# Patient Record
Sex: Male | Born: 1980 | Race: White | Hispanic: No | Marital: Married | Smoking: Current every day smoker
Health system: Southern US, Community
[De-identification: ages and names within clinical notes are randomized; demographics above are authoritative.]

## PROBLEM LIST (undated history)

## (undated) DIAGNOSIS — F209 Schizophrenia, unspecified: Secondary | ICD-10-CM

## (undated) DIAGNOSIS — Z789 Other specified health status: Secondary | ICD-10-CM

## (undated) HISTORY — PX: FRACTURE SURGERY: SHX138

---

## 2015-05-11 ENCOUNTER — Emergency Department (HOSPITAL_COMMUNITY)
Admission: EM | Admit: 2015-05-11 | Discharge: 2015-05-11 | Disposition: A | Discharge status: Home / Self Care | Payer: Self-pay | Attending: Emergency Medicine | Admitting: Emergency Medicine

## 2015-05-11 ENCOUNTER — Emergency Department (HOSPITAL_COMMUNITY): Payer: Self-pay

## 2015-05-11 ENCOUNTER — Encounter (HOSPITAL_COMMUNITY): Payer: Self-pay | Admitting: Emergency Medicine

## 2015-05-11 DIAGNOSIS — S82892A Other fracture of left lower leg, initial encounter for closed fracture: Secondary | ICD-10-CM

## 2015-05-11 DIAGNOSIS — Z79899 Other long term (current) drug therapy: Secondary | ICD-10-CM | POA: Insufficient documentation

## 2015-05-11 DIAGNOSIS — X58XXXA Exposure to other specified factors, initial encounter: Secondary | ICD-10-CM | POA: Insufficient documentation

## 2015-05-11 DIAGNOSIS — Y9289 Other specified places as the place of occurrence of the external cause: Secondary | ICD-10-CM | POA: Insufficient documentation

## 2015-05-11 DIAGNOSIS — S82899A Other fracture of unspecified lower leg, initial encounter for closed fracture: Secondary | ICD-10-CM | POA: Insufficient documentation

## 2015-05-11 DIAGNOSIS — Z720 Tobacco use: Secondary | ICD-10-CM | POA: Insufficient documentation

## 2015-05-11 DIAGNOSIS — Y9389 Activity, other specified: Secondary | ICD-10-CM | POA: Insufficient documentation

## 2015-05-11 DIAGNOSIS — Y998 Other external cause status: Secondary | ICD-10-CM | POA: Insufficient documentation

## 2015-05-11 MED ORDER — TRAMADOL HCL 50 MG PO TABS
50.0000 mg | ORAL_TABLET | Freq: Once | ORAL | Status: AC
  Administered 2015-05-11: 50 mg via ORAL
  Filled 2015-05-11: qty 1

## 2015-05-11 MED ORDER — TRAMADOL HCL 50 MG PO TABS: 50.0000 mg | ORAL_TABLET | Freq: Four times a day (QID) | ORAL | Status: DC | PRN

## 2015-05-11 NOTE — ED Provider Notes (Signed)
CSN: [id number]     Arrival date & time 05/11/15  2002 History  This chart was scribed for a non-physician practitioner, Earley FavorGail Jen Benedict, NP working with Lorre NickAnthony Allen, MD by Evon Slackerrance Branch, ED Scribe. This patient was seen in room WTR9/WTR9 and the patient's care was started at 9:53 PM.      Chief Complaint  Patient presents with  . Ankle Injury    left   The history is provided by the patient. No language interpreter was used.   HPI Comments: Dale Riley is a 34 y.o. male who presents to the Emergency Department complaining of left ankle injury onset 5 days prior. Pt states he has associated swelling. Pt states that he was stepping off a platform about 32 inches high and rolled his ankle. Pt states that he has tried Advil with no relief. Pt states that the pain is worse when bearing weight or movement. Pt denies any numbness or tingling.    History reviewed. No pertinent past medical history. Past Surgical History  Procedure Laterality Date  . Fracture surgery     History reviewed. No pertinent family history. History  Substance Use Topics  . Smoking status: Current Every Day Smoker -- 2.00 packs/day    Types: Cigarettes  . Smokeless tobacco: Not on file  . Alcohol Use: No    Review of Systems  Musculoskeletal: Positive for joint swelling and arthralgias.  Neurological: Negative for numbness.  All other systems reviewed and are negative.    Allergies  Review of patient's allergies indicates no known allergies.  Home Medications   Prior to Admission medications   Medication Sig Start Date End Date Taking? Authorizing Provider  ibuprofen (ADVIL,MOTRIN) 200 MG tablet Take 800 mg by mouth every 6 (six) hours as needed for moderate pain (pain).   Yes Historical Provider, MD   BP 134/78 mmHg  Pulse 83  Temp(Src) 98.6 F (37 C) (Oral)  Resp 17  SpO2 100%   Physical Exam  Constitutional: He is oriented to person, place, and time. He appears well-developed and  well-nourished. No distress.  HENT:  Head: Normocephalic and atraumatic.  Eyes: Conjunctivae and EOM are normal.  Neck: Neck supple. No tracheal deviation present.  Cardiovascular: Normal rate.   Pulmonary/Chest: Effort normal. No respiratory distress.  Musculoskeletal:       Left ankle: He exhibits decreased range of motion, swelling and ecchymosis. He exhibits no deformity. Tenderness.       Feet:  Neurological: He is alert and oriented to person, place, and time.  Skin: Skin is warm and dry.  Psychiatric: He has a normal mood and affect. His behavior is normal.  Nursing note and vitals reviewed.   ED Course  Procedures (including critical care time) DIAGNOSTIC STUDIES: Oxygen Saturation is 100% on RA, normal by my interpretation.    COORDINATION OF CARE: 10:29 PM-Discussed treatment plan with pt at bedside and pt agreed to plan.    Labs Review Labs Reviewed - No data to display  Imaging Review Dg Ankle Complete Left  05/11/2015   CLINICAL DATA:  Patient stepped off of a platform while at work five days ago causing a twisting injury to the left ankle and Foot. Lateral pain and bruising. Initial encounter.  EXAM: LEFT ANKLE COMPLETE - 3+ VIEW  COMPARISON:  None.  FINDINGS: Tiny avulsion fracture fragment position between the lateral malleolus and the lateral aspect of the talus. No evidence of acute fracture elsewhere. Ankle mortise intact with well preserved joint space. Subchondral lucency  involving the medial aspect of the dome of the talus, surrounded by sclerosis. No other intrinsic osseous abnormalities.  IMPRESSION: 1. Avulsion fracture which likely arose from the lateral aspect of the talus at the insertion of the talofibular ligament. 2. 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   Electronically Signed   By: Hulan Saas M.D.   On: 05/11/2015 21:19   Dg Foot Complete Left  05/11/2015   CLINICAL DATA:  Stepped off platform at work, and rolled left  ankle and foot. Bruising about the foot. Initial encounter.  EXAM: LEFT FOOT - COMPLETE 3+ VIEW  COMPARISON:  None.  FINDINGS: There is no evidence of fracture or dislocation. The joint spaces are preserved. There is no evidence of talar subluxation; the subtalar joint is unremarkable in appearance.  Mild dorsal soft tissue swelling is noted at the forefoot.  IMPRESSION: No evidence of fracture or dislocation.   Electronically Signed   By: Roanna Raider M.D.   On: 05/11/2015 21:26   Will place patient in posterior splint, pain control and follow-up with orthopedic surgery MDM   Final diagnoses:  None      I personally performed the services described in this documentation, which was scribed in my presence. The recorded information has been reviewed and is accurate.    Earley Favor, NP 05/11/15 1610  Lorre Nick, MD 05/11/15 [phone]

## 2015-05-11 NOTE — Discharge Instructions (Signed)
Ankle Fracture  A fracture is a break in a bone. The ankle joint is made up of three bones. These include the lower (distal)sections of your lower leg bones, called the tibia and fibula, along with a bone in your foot, called the talus. Depending on how bad the break is and if more than one ankle joint bone is broken, a cast or splint is used to protect and keep your injured bone from moving while it heals. Sometimes, surgery is required to help the fracture heal properly.   There are two general types of fractures:   Stable fracture. This includes a single fracture line through one bone, with no injury to ankle ligaments. A fracture of the talus that does not have any displacement (movement of the bone on either side of the fracture line) is also stable.   Unstable fracture. This includes more than one fracture line through one or more bones in the ankle joint. It also includes fractures that have displacement of the bone on either side of the fracture line.  CAUSES   A direct blow to the ankle.    Quickly and severely twisting your ankle.   Trauma, such as a car accident or falling from a significant height.  RISK FACTORS  You may be at a higher risk of ankle fracture if:   You have certain medical conditions.   You are involved in high-impact sports.   You are involved in a high-impact car accident.  SIGNS AND SYMPTOMS    Tender and swollen ankle.   Bruising around the injured ankle.   Pain on movement of the ankle.   Difficulty walking or putting weight on the ankle.   A cold foot below the site of the ankle injury. This can occur if the blood vessels passing through your injured ankle were also damaged.   Numbness in the foot below the site of the ankle injury.  DIAGNOSIS   An ankle fracture is usually diagnosed with a physical exam and X-rays. A CT scan may also be required for complex fractures.  TREATMENT   Stable fractures are treated with a cast or splint and using crutches to avoid putting  weight on your injured ankle. This is followed by an ankle strengthening program. Some patients require a special type of cast, depending on other medical problems they may have. Unstable fractures require surgery to ensure the bones heal properly. Your health care provider will tell you what type of fracture you have and the best treatment for your condition.  HOME CARE INSTRUCTIONS    Review correct crutch use with your health care provider and use your crutches as directed. Safe use of crutches is extremely important. Misuse of crutches can cause you to fall or cause injury to nerves in your hands or armpits.   Do not put weight or pressure on the injured ankle until directed by your health care provider.   To lessen the swelling, keep the injured leg elevated while sitting or lying down.   Apply ice to the injured area:   Put ice in a plastic bag.   Place a towel between your cast and the bag.   Leave the ice on for 20 minutes, 2-3 times a day.   If you have a plaster or fiberglass cast:   Do not try to scratch the skin under the cast with any objects. This can increase your risk of skin infection.   Check the skin around the cast every day. You   may put lotion on any red or sore areas.   Keep your cast dry and clean.   If you have a plaster splint:   Wear the splint as directed.   You may loosen the elastic around the splint if your toes become numb, tingle, or turn cold or blue.   Do not put pressure on any part of your cast or splint; it may break. Rest your cast only on a pillow the first 24 hours until it is fully hardened.   Your cast or splint can be protected during bathing with a plastic bag sealed to your skin with medical tape. Do not lower the cast or splint into water.   Take medicines as directed by your health care provider. Only take over-the-counter or prescription medicines for pain, discomfort, or fever as directed by your health care provider.   Do not drive a vehicle until  your health care provider specifically tells you it is safe to do so.   If your health care provider has given you a follow-up appointment, it is very important to keep that appointment. Not keeping the appointment could result in a chronic or permanent injury, pain, and disability. If you have any problem keeping the appointment, call the facility for assistance.  SEEK MEDICAL CARE IF:  You develop increased swelling or discomfort.  SEEK IMMEDIATE MEDICAL CARE IF:    Your cast gets damaged or breaks.   You have continued severe pain.   You develop new pain or swelling after the cast was put on.   Your skin or toenails below the injury turn blue or gray.   Your skin or toenails below the injury feel cold, numb, or have loss of sensitivity to touch.   There is a bad smell or pus draining from under the cast.  MAKE SURE YOU:    Understand these instructions.   Will watch your condition.   Will get help right away if you are not doing well or get worse.  Document Released: 11/24/2000 Document Revised: 12/02/2013 Document Reviewed: 06/26/2013  [employer] Patient Information 2015 [employer]. This information is not intended to replace advice given to you by your health care provider. Make sure you discuss any questions you have with your health care provider.

## 2015-05-11 NOTE — ED Notes (Signed)
Patient reports left ankle injury Thursday. Was stepping off a platform (around 32 inches high) and rolled his ankle over. Says pain has increased but swelling has decreased. No other c/c. Has not had any pain medications today.

## 2015-05-11 NOTE — ED Notes (Signed)
Questions concerns denied r/t dc. Pt ambulatory with crutches. Pt a&ox4

## 2016-06-30 IMAGING — CR DG ANKLE COMPLETE 3+V*L*
3 series · 3 of 3 positions shown · non-contrast
Comparison: None.

CLINICAL DATA: Patient stepped off of a platform while at work five
days ago causing a twisting injury to the left ankle and Foot.
Lateral pain and bruising. Initial encounter.

EXAM:
LEFT ANKLE COMPLETE - 3+ VIEW

[x ankle ap left]
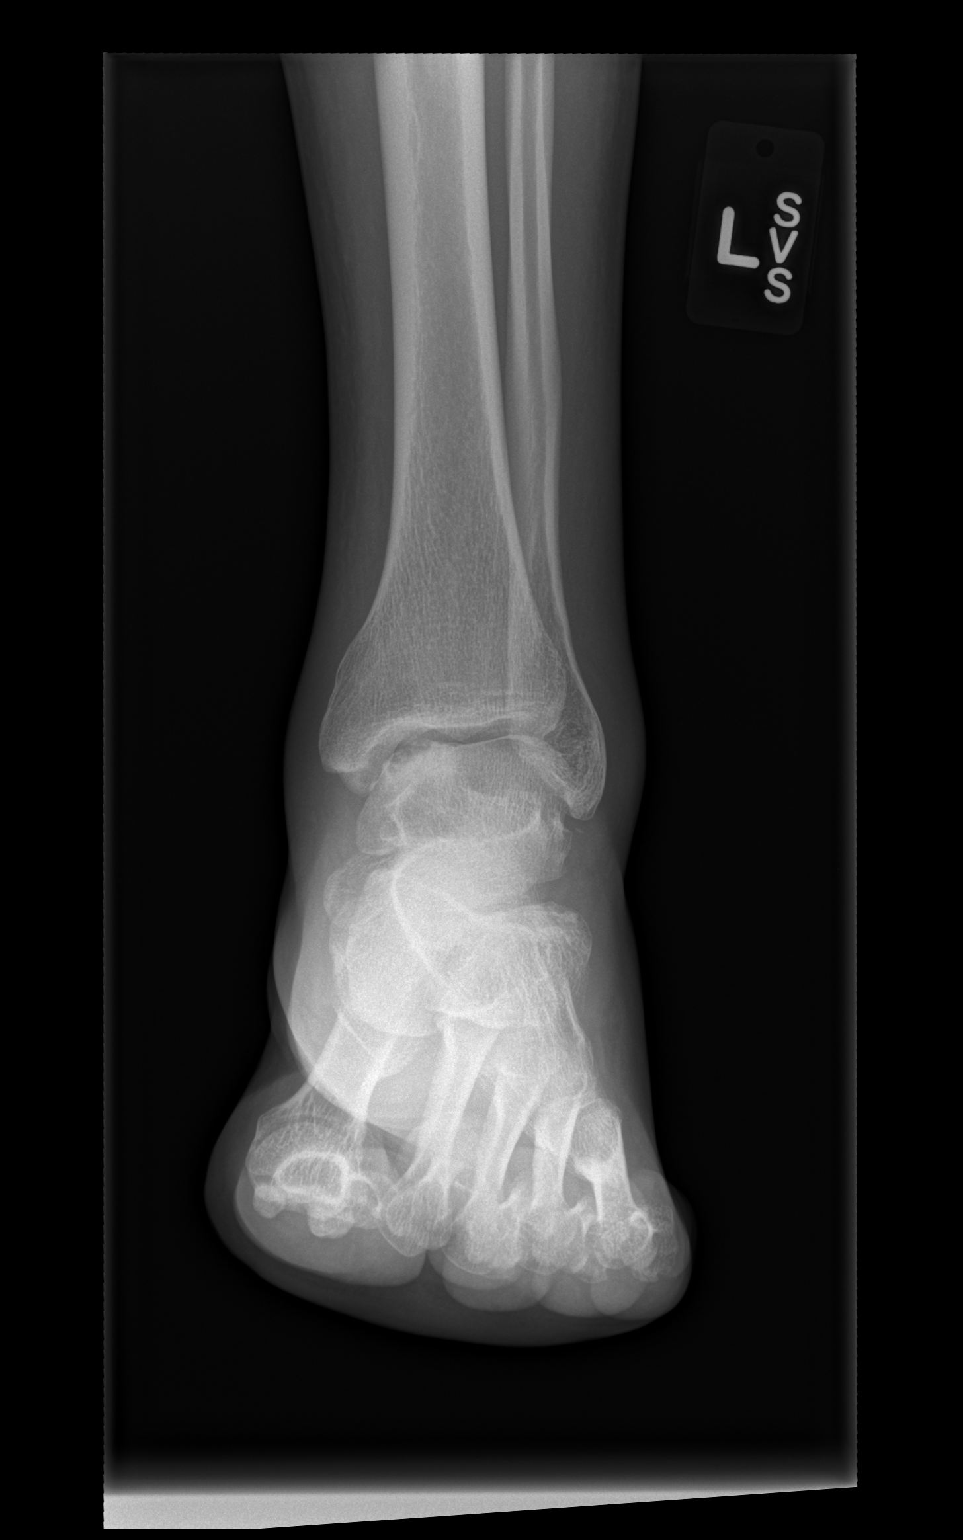

[x ankle obl left]
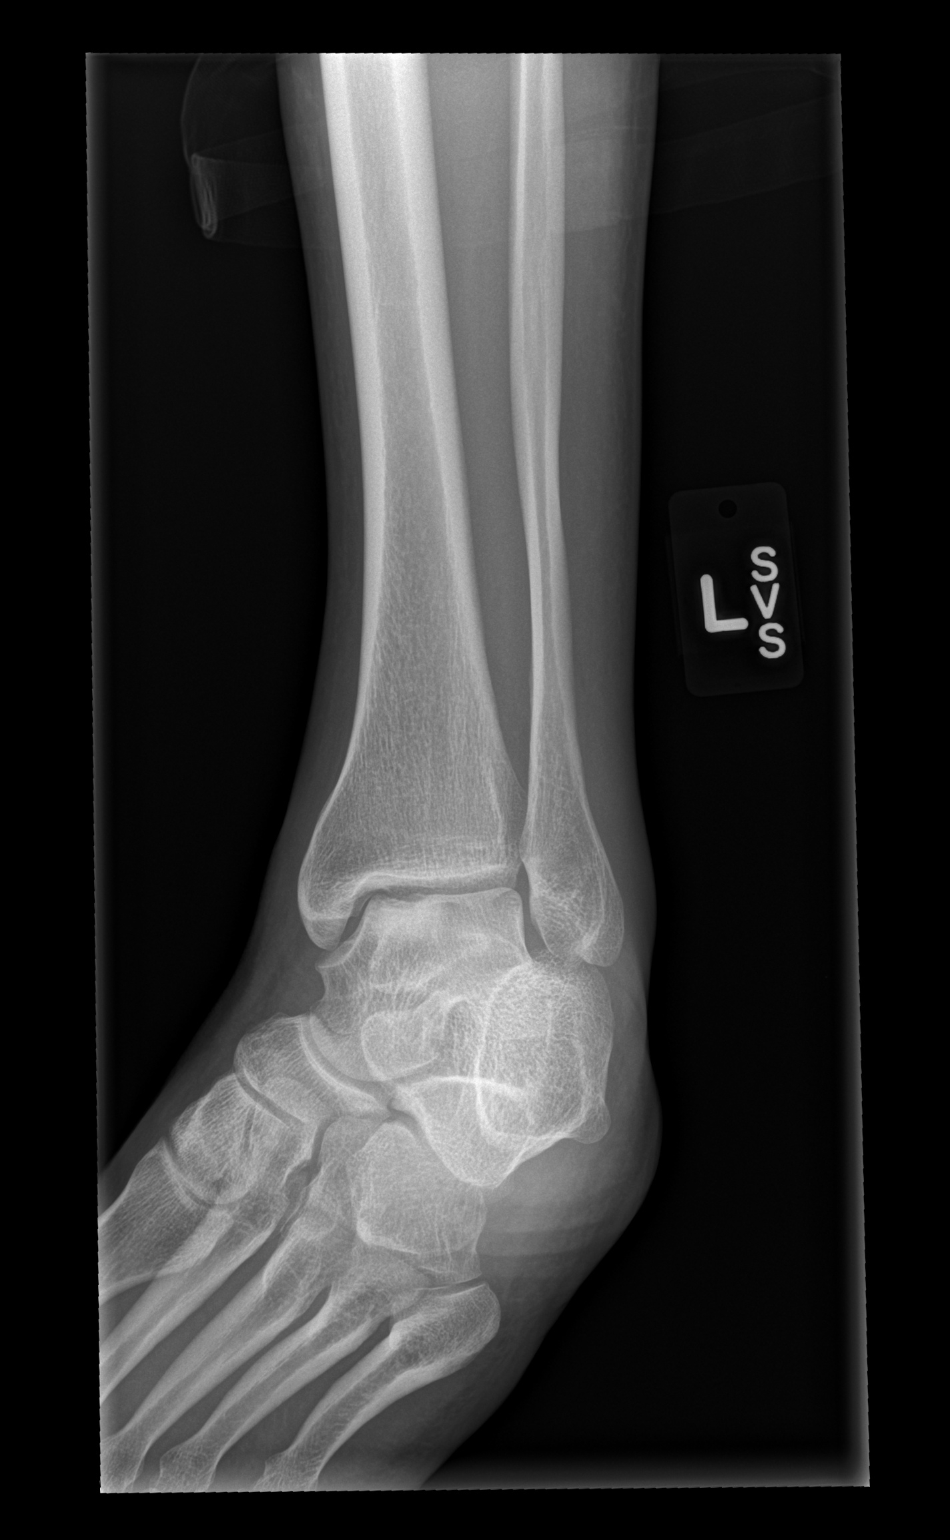

[x ankle lat left]
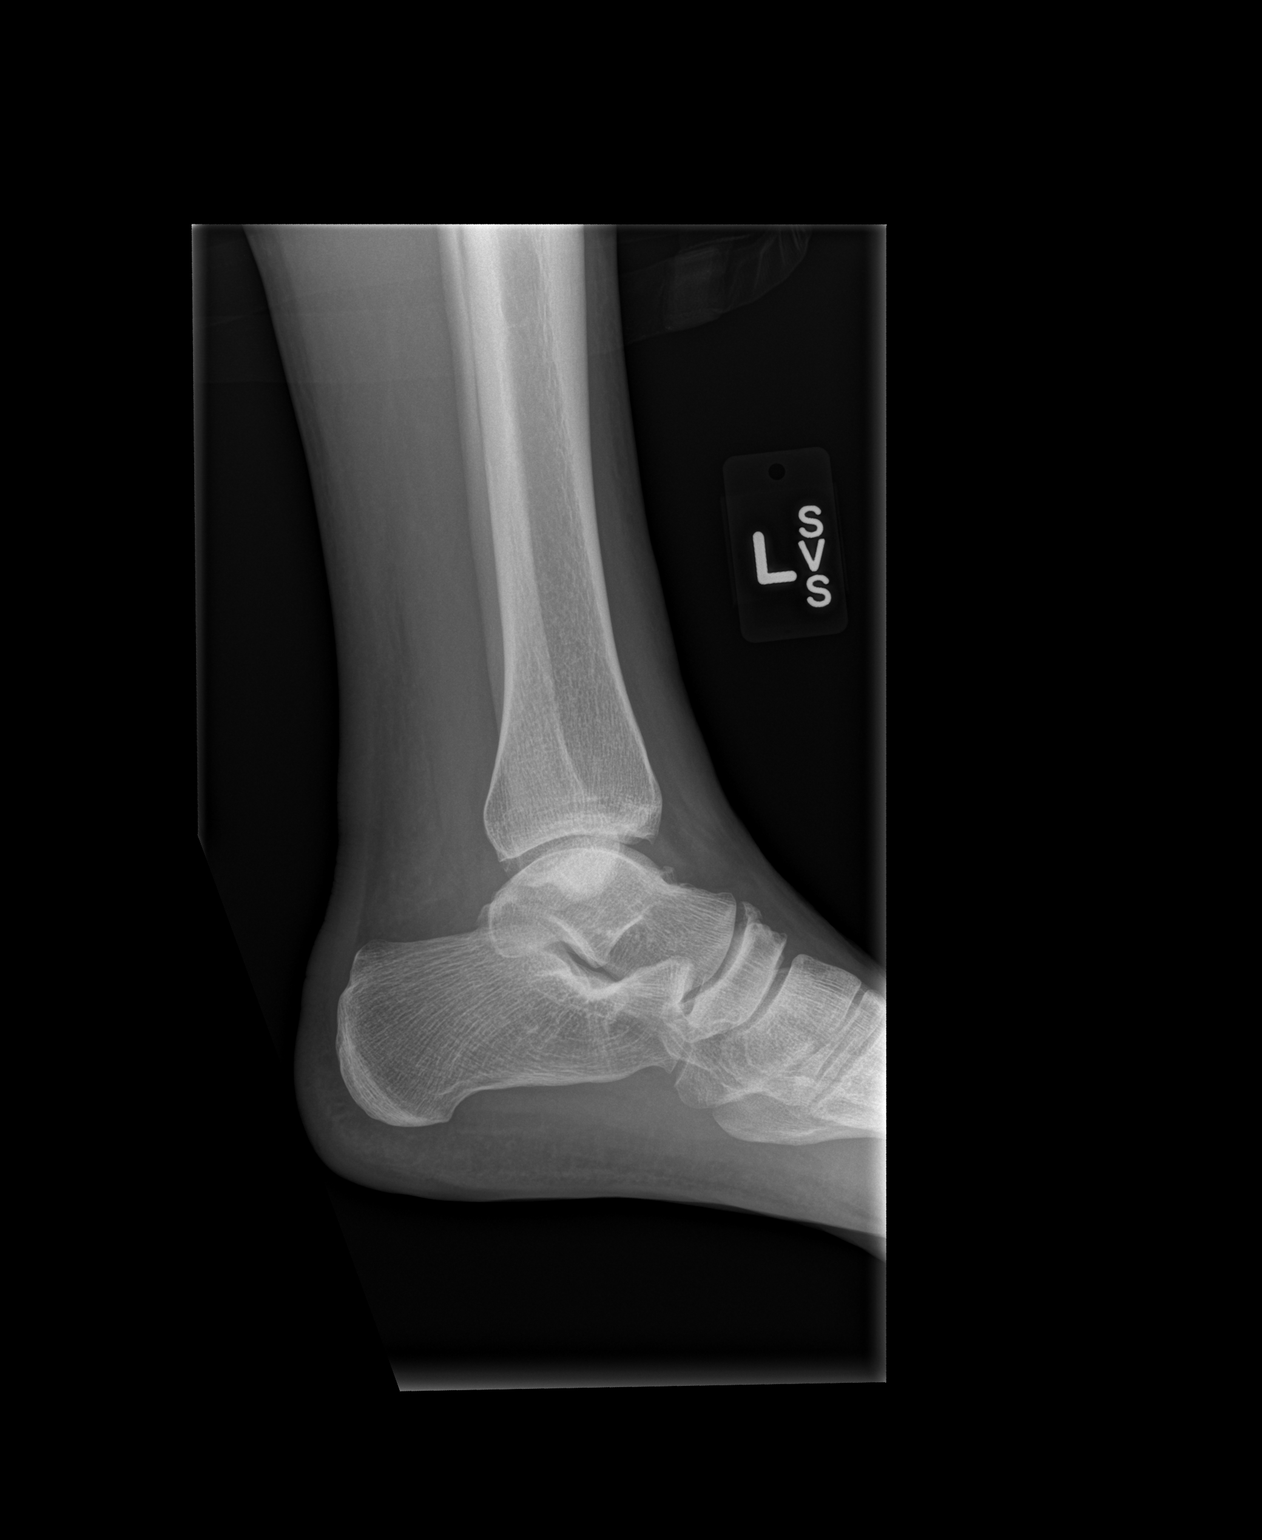

[3 of 3 positions shown; findings below may reference images not displayed]

FINDINGS: Tiny avulsion fracture fragment position between the lateral
malleolus and the lateral aspect of the talus. No evidence of acute
fracture elsewhere. Ankle mortise intact with well preserved joint
space. Subchondral lucency involving the medial aspect of the dome
of the talus, surrounded by sclerosis. No other intrinsic osseous
abnormalities.
IMPRESSION: 1. Avulsion fracture which likely arose from the lateral aspect of
the talus at the insertion of the talofibular ligament.
2. Osteonecrosis involving the medial aspect of the talar dome.

## 2016-07-23 ENCOUNTER — Encounter (HOSPITAL_COMMUNITY): Payer: Self-pay | Admitting: *Deleted

## 2016-07-23 DIAGNOSIS — S80862A Insect bite (nonvenomous), left lower leg, initial encounter: Secondary | ICD-10-CM | POA: Insufficient documentation

## 2016-07-23 DIAGNOSIS — S60562A Insect bite (nonvenomous) of left hand, initial encounter: Secondary | ICD-10-CM | POA: Insufficient documentation

## 2016-07-23 DIAGNOSIS — S80861A Insect bite (nonvenomous), right lower leg, initial encounter: Secondary | ICD-10-CM | POA: Insufficient documentation

## 2016-07-23 DIAGNOSIS — Y929 Unspecified place or not applicable: Secondary | ICD-10-CM | POA: Insufficient documentation

## 2016-07-23 DIAGNOSIS — Y999 Unspecified external cause status: Secondary | ICD-10-CM | POA: Insufficient documentation

## 2016-07-23 DIAGNOSIS — R51 Headache: Secondary | ICD-10-CM | POA: Insufficient documentation

## 2016-07-23 DIAGNOSIS — Y939 Activity, unspecified: Secondary | ICD-10-CM | POA: Insufficient documentation

## 2016-07-23 DIAGNOSIS — S60561A Insect bite (nonvenomous) of right hand, initial encounter: Secondary | ICD-10-CM | POA: Insufficient documentation

## 2016-07-23 DIAGNOSIS — W57XXXA Bitten or stung by nonvenomous insect and other nonvenomous arthropods, initial encounter: Secondary | ICD-10-CM | POA: Insufficient documentation

## 2016-07-23 DIAGNOSIS — F17210 Nicotine dependence, cigarettes, uncomplicated: Secondary | ICD-10-CM | POA: Insufficient documentation

## 2016-07-23 NOTE — ED Triage Notes (Signed)
The pt is c/o a headache all day with n v and body aches.  The pt reports tht his wife pulled over 100 ticks of him yesteerday.  He is here barefoot

## 2016-07-24 ENCOUNTER — Emergency Department (HOSPITAL_COMMUNITY)
Admission: EM | Admit: 2016-07-24 | Discharge: 2016-07-24 | Disposition: A | Discharge status: Home / Self Care | Payer: Self-pay | Attending: Emergency Medicine | Admitting: Emergency Medicine

## 2016-07-24 DIAGNOSIS — R51 Headache: Secondary | ICD-10-CM

## 2016-07-24 DIAGNOSIS — R519 Headache, unspecified: Secondary | ICD-10-CM

## 2016-07-24 DIAGNOSIS — W57XXXA Bitten or stung by nonvenomous insect and other nonvenomous arthropods, initial encounter: Secondary | ICD-10-CM

## 2016-07-24 LAB — BASIC METABOLIC PANEL
Anion gap: 4 — ABNORMAL LOW (ref 5–15)
BUN: 8 mg/dL (ref 6–20)
CALCIUM: 8.9 mg/dL (ref 8.9–10.3)
CO2: 24 mmol/L (ref 22–32)
CREATININE: 0.91 mg/dL (ref 0.61–1.24)
Chloride: 106 mmol/L (ref 101–111)
Glucose, Bld: 132 mg/dL — ABNORMAL HIGH (ref 65–99)
Potassium: 3.5 mmol/L (ref 3.5–5.1)
SODIUM: 134 mmol/L — AB (ref 135–145)

## 2016-07-24 LAB — CBC WITH DIFFERENTIAL/PLATELET
BASOS PCT: 0 %
Basophils Absolute: 0 10*3/uL (ref 0.0–0.1)
EOS ABS: 0.4 10*3/uL (ref 0.0–0.7)
Eosinophils Relative: 4 %
HCT: 40.5 % (ref 39.0–52.0)
HEMOGLOBIN: 14.2 g/dL (ref 13.0–17.0)
Lymphocytes Relative: 23 %
Lymphs Abs: 2.6 10*3/uL (ref 0.7–4.0)
MCH: 30.4 pg (ref 26.0–34.0)
MCHC: 35.1 g/dL (ref 30.0–36.0)
MCV: 86.7 fL (ref 78.0–100.0)
MONOS PCT: 6 %
Monocytes Absolute: 0.6 10*3/uL (ref 0.1–1.0)
NEUTROS PCT: 67 %
Neutro Abs: 7.4 10*3/uL (ref 1.7–7.7)
Platelets: 207 10*3/uL (ref 150–400)
RBC: 4.67 MIL/uL (ref 4.22–5.81)
RDW: 12.9 % (ref 11.5–15.5)
WBC: 11 10*3/uL — AB (ref 4.0–10.5)

## 2016-07-24 MED ORDER — METOCLOPRAMIDE HCL 5 MG/ML IJ SOLN
10.0000 mg | Freq: Once | INTRAMUSCULAR | Status: AC
  Administered 2016-07-24: 10 mg via INTRAVENOUS
  Filled 2016-07-24: qty 2

## 2016-07-24 MED ORDER — DOXYCYCLINE HYCLATE 100 MG PO TABS
100.0000 mg | ORAL_TABLET | Freq: Once | ORAL | Status: AC
  Administered 2016-07-24: 100 mg via ORAL
  Filled 2016-07-24: qty 1

## 2016-07-24 MED ORDER — DIPHENHYDRAMINE HCL 50 MG/ML IJ SOLN
25.0000 mg | Freq: Once | INTRAMUSCULAR | Status: AC
  Administered 2016-07-24: 25 mg via INTRAVENOUS
  Filled 2016-07-24: qty 1

## 2016-07-24 MED ORDER — DOXYCYCLINE HYCLATE 100 MG PO CAPS: 100.0000 mg | ORAL_CAPSULE | Freq: Two times a day (BID) | ORAL | 0 refills | Status: AC

## 2016-07-24 NOTE — ED Provider Notes (Signed)
[facility]-EMERGENCY DEPT Provider Note   CSN: [id number] Arrival date & time: 07/23/16  2300   History   Chief Complaint Chief Complaint  Patient presents with  . Headache    HPI Dale GowdaJoseph Riley is a 35 y.o. male.  Patient with history of migraine headaches presents with complaint of body aches and headache starting today. Patient has not had any associated vision symptoms, URI symptoms, nausea, vomiting, diarrhea. No confusion or altered mental status. No weakness, numbness, tingling in extremities. Normal ambulation. Patient states that he spends a considerable time outside. States that yesterday his friend pulled 100 takes off of him. Patient has one of these in a jar and appears to be a very small insect, smaller than a deer tick. Patient does however report taking ticks off of his body on nearly a daily basis. He has small red bumps where he was bitten, however no significant rashes. No neck pain or stiffness. The onset of this condition was acute. The course is constant. Aggravating factors: light. Alleviating factors: none.        History reviewed. No pertinent past medical history.  There are no active problems to display for this patient.   Past Surgical History:  Procedure Laterality Date  . FRACTURE SURGERY         Home Medications    Prior to Admission medications   Medication Sig Start Date End Date Taking? Authorizing Provider  traMADol (ULTRAM) 50 MG tablet Take 1 tablet (50 mg total) by mouth every 6 (six) hours as needed. Patient not taking: Reported on 07/24/2016 05/11/15   Earley FavorGail Schulz, NP    Family History No family history on file.  Social History Social History  Substance Use Topics  . Smoking status: Current Every Day Smoker    Packs/day: 2.00    Types: Cigarettes  . Smokeless tobacco: Never Used  . Alcohol use No     Allergies   Review of patient's allergies indicates no known allergies.   Review of Systems Review of Systems    Constitutional: Negative for fever.  HENT: Negative for congestion, dental problem, rhinorrhea and sinus pressure.   Eyes: Positive for photophobia. Negative for discharge, redness and visual disturbance.  Respiratory: Negative for shortness of breath.   Cardiovascular: Negative for chest pain.  Gastrointestinal: Negative for nausea and vomiting.  Musculoskeletal: Positive for myalgias. Negative for gait problem, neck pain and neck stiffness.  Skin: Negative for rash.  Neurological: Positive for headaches. Negative for syncope, speech difficulty, weakness, light-headedness and numbness.  Psychiatric/Behavioral: Negative for confusion.     Physical Exam Updated Vital Signs BP 116/84 (BP Location: Right Arm)   Pulse 60   Temp 98.6 F (37 C) (Oral)   Resp 16   Ht 5\' 10"  (1.778 m)   Wt 69.2 kg   SpO2 100%   BMI 21.89 kg/m   Physical Exam  Constitutional: He is oriented to person, place, and time. He appears well-developed and well-nourished.  HENT:  Head: Normocephalic and atraumatic.  Right Ear: Tympanic membrane, external ear and ear canal normal.  Left Ear: Tympanic membrane, external ear and ear canal normal.  Nose: Nose normal.  Mouth/Throat: Uvula is midline, oropharynx is clear and moist and mucous membranes are normal.  Eyes: Conjunctivae, EOM and lids are normal. Pupils are equal, round, and reactive to light.  Neck: Normal range of motion. Neck supple.  No meningismus. Patient with full range of motion in cervical spine without pain.  Cardiovascular: Normal rate and regular rhythm.  Pulmonary/Chest: Effort normal and breath sounds normal.  Abdominal: Soft. There is no tenderness.  Musculoskeletal: Normal range of motion.       Cervical back: He exhibits normal range of motion, no tenderness and no bony tenderness.  Neurological: He is alert and oriented to person, place, and time. He has normal strength and normal reflexes. No cranial nerve deficit or sensory  deficit. He exhibits normal muscle tone. He displays a negative Romberg sign. Coordination and gait normal. GCS eye subscore is 4. GCS verbal subscore is 5. GCS motor subscore is 6.  Skin: Skin is warm and dry.  Patient with small punctate "bite marks" scattered over his torso, upper extremities, lower extremities. No associated cellulitis or rash. No erythema migrans-appearing rashes.  Psychiatric: He has a normal mood and affect.  Nursing note and vitals reviewed.    ED Treatments / Results  Labs (all labs ordered are listed, but only abnormal results are displayed) Labs Reviewed  CBC WITH DIFFERENTIAL/PLATELET  BASIC METABOLIC PANEL    EKG  EKG Interpretation None       Radiology No results found.  Procedures Procedures (including critical care time)  Medications Ordered in ED Medications  metoCLOPramide (REGLAN) injection 10 mg (not administered)  diphenhydrAMINE (BENADRYL) injection 25 mg (not administered)  doxycycline (VIBRA-TABS) tablet 100 mg (not administered)     Initial Impression / Assessment and Plan / ED Course  I have reviewed the triage vital signs and the nursing notes.  Pertinent labs & imaging results that were available during my care of the patient were reviewed by me and considered in my medical decision making (see chart for details).  Clinical Course  Comment By Time  Patient seen and examined. Will treat headache, check basic labs. Patient has no signs concerning for meningitis. Appears well, non-toxic. Laughing in room.  Renne CriglerJoshua Leslyn Monda, PA-C 08/14 [phone]  Patient feels better. Agreeable to discharge to home. Exam stable. No signs of meningismus. No fever.  Discussed return to the emergency department with worsening severe headache, vomiting, confusion, fever, more prominent rash. Patient and wife verbalized understanding and agrees with plan.  Discussed UV exposure precautions when on doxycycline. Patient is a Psychologist, occupationalwelder. He understands that he  needs to protect exposed areas of skin in sunlight and while working. Renne CriglerJoshua Aribelle Mccosh, PA-C 08/14 0220      Final Clinical Impressions(s) / ED Diagnoses   Final diagnoses:  Acute nonintractable headache, unspecified headache type  Tick bite   Patient with HA, possible recent tick-bites.H/o migraine HA.  Patient without high-risk features of headache including: sudden onset/thunderclap HA, altered mental status, accompanying seizure, headache with exertion, age > 9150, history of immunocompromise, neck or shoulder pain, fever, use of anticoagulation, family history of spontaneous SAH, concomitant drug use, toxic exposure.   Patient has a normal complete neurological exam, normal vital signs, normal level of consciousness, no signs of meningismus, is well-appearing/non-toxic appearing, no signs of trauma.   Will treat with doxycycline out of abundance of caution due to possible exposure with atypical HA.   Imaging with CT/MRI not indicated given history and physical exam findings.   No dangerous or life-threatening conditions suspected or identified by history, physical exam, and by work-up. No indications for hospitalization identified.    New Prescriptions New Prescriptions   DOXYCYCLINE (VIBRAMYCIN) 100 MG CAPSULE    Take 1 capsule (100 mg total) by mouth 2 (two) times daily.     Renne CriglerJoshua Darthy Manganelli, PA-C 07/24/16 0222    Charlynne Panderavid Hsienta Yao, MD  07/28/16 1540  

## 2016-07-24 NOTE — Discharge Instructions (Signed)
Please read and follow all provided instructions.  Your diagnoses today include:  1. Acute nonintractable headache, unspecified headache type   2. Tick bite     Tests performed today include:  Vital signs. See below for your results today.   Medications:  In the Emergency Department you received:  Reglan - antinausea/headache medication  Benadryl - antihistamine to counteract potential side effects of reglan   Doxycycline - antibiotic  You have been prescribed an antibiotic medicine: take the entire course of medicine even if you are feeling better. Stopping early can cause the antibiotic not to work.  Take any prescribed medications only as directed.  Additional information:  Follow any educational materials contained in this packet.  You are having a headache. No specific cause was found today for your headache. It may have been a migraine or other cause of headache. Stress, anxiety, fatigue, and depression are common triggers for headaches.   Your headache today does not appear to be life-threatening or require hospitalization, but often the exact cause of headaches is not determined in the emergency department. Therefore, follow-up with your doctor is very important to find out what may have caused your headache and whether or not you need any further diagnostic testing or treatment.   Sometimes headaches can appear benign (not harmful), but then more serious symptoms can develop which should prompt an immediate re-evaluation by your doctor or the emergency department.  BE VERY CAREFUL not to take multiple medicines containing Tylenol (also called acetaminophen). Doing so can lead to an overdose which can damage your liver and cause liver failure and possibly death.   Follow-up instructions: Please follow-up with your primary care provider in the next 3 days for further evaluation of your symptoms.   Return instructions:   Please return to the Emergency Department if you  experience worsening symptoms.  Return if the medications do not resolve your headache, if it recurs, or if you have multiple episodes of vomiting or cannot keep down fluids.  Return if you have a change from the usual headache.  RETURN IMMEDIATELY IF you:  Develop a sudden, severe headache  Develop confusion or become poorly responsive or faint  Develop a fever above 100.87F or problem breathing  Have a change in speech, vision, swallowing, or understanding  Develop new weakness, numbness, tingling, incoordination in your arms or legs  Have a seizure  Please return if you have any other emergent concerns.  Additional Information:  Your vital signs today were: BP 112/74    Pulse (!) 55    Temp 98.6 F (37 C) (Oral)    Resp 16    Ht 5\' 10"  (1.778 m)    Wt 69.2 kg    SpO2 100%    BMI 21.89 kg/m  If your blood pressure (BP) was elevated above 135/85 this visit, please have this repeated by your doctor within one month. --------------

## 2017-12-21 ENCOUNTER — Emergency Department (HOSPITAL_COMMUNITY)
Admission: EM | Admit: 2017-12-21 | Discharge: 2017-12-21 | Disposition: A | Discharge status: Home / Self Care | Payer: Self-pay | Attending: Emergency Medicine | Admitting: Emergency Medicine

## 2017-12-21 ENCOUNTER — Emergency Department (HOSPITAL_COMMUNITY): Payer: Self-pay

## 2017-12-21 ENCOUNTER — Encounter (HOSPITAL_COMMUNITY): Payer: Self-pay | Admitting: Emergency Medicine

## 2017-12-21 DIAGNOSIS — S233XXA Sprain of ligaments of thoracic spine, initial encounter: Secondary | ICD-10-CM | POA: Insufficient documentation

## 2017-12-21 DIAGNOSIS — M549 Dorsalgia, unspecified: Secondary | ICD-10-CM

## 2017-12-21 DIAGNOSIS — Y9259 Other trade areas as the place of occurrence of the external cause: Secondary | ICD-10-CM | POA: Insufficient documentation

## 2017-12-21 DIAGNOSIS — S239XXA Sprain of unspecified parts of thorax, initial encounter: Secondary | ICD-10-CM

## 2017-12-21 DIAGNOSIS — Y9389 Activity, other specified: Secondary | ICD-10-CM | POA: Insufficient documentation

## 2017-12-21 DIAGNOSIS — Y990 Civilian activity done for income or pay: Secondary | ICD-10-CM | POA: Insufficient documentation

## 2017-12-21 DIAGNOSIS — X500XXA Overexertion from strenuous movement or load, initial encounter: Secondary | ICD-10-CM | POA: Insufficient documentation

## 2017-12-21 DIAGNOSIS — F17210 Nicotine dependence, cigarettes, uncomplicated: Secondary | ICD-10-CM | POA: Insufficient documentation

## 2017-12-21 MED ORDER — CYCLOBENZAPRINE HCL 10 MG PO TABS: 10.0000 mg | ORAL_TABLET | Freq: Two times a day (BID) | ORAL | 0 refills | Status: AC | PRN

## 2017-12-21 MED ORDER — CYCLOBENZAPRINE HCL 10 MG PO TABS
10.0000 mg | ORAL_TABLET | Freq: Once | ORAL | Status: AC
  Administered 2017-12-21: 10 mg via ORAL
  Filled 2017-12-21: qty 1

## 2017-12-21 MED ORDER — KETOROLAC TROMETHAMINE 30 MG/ML IJ SOLN
30.0000 mg | Freq: Once | INTRAMUSCULAR | Status: AC
  Administered 2017-12-21: 30 mg via INTRAMUSCULAR
  Filled 2017-12-21: qty 1

## 2017-12-21 NOTE — ED Provider Notes (Signed)
[facility]-EMERGENCY DEPT Provider Note   CSN: [id number] Arrival date & time: 12/21/17  [id number]     History   Chief Complaint Chief Complaint  Patient presents with  . Back Pain    HPI Dale GowdaJoseph Riley is a 37 y.o. male.  The history is provided by the patient.  Back Pain   This is a new problem. Episode onset: 3 days ago. The problem occurs constantly. 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 The pain is present in the thoracic spine. The quality of the pain is described as shooting and stabbing. The pain does not radiate. The pain is at a severity of 9/10. The pain is severe. The symptoms are aggravated by bending, twisting and certain positions. The pain is worse during the night. Stiffness is present in the morning. Pertinent negatives include no chest pain, no fever, no numbness, no bowel incontinence, no bladder incontinence, no dysuria, no leg pain, no paresthesias, no paresis, no tingling and no weakness. Associated symptoms comments: No SOB or cough. He has tried NSAIDs for the symptoms. The treatment provided no relief.    History reviewed. No pertinent past medical history.  There are no active problems to display for this patient.   Past Surgical History:  Procedure Laterality Date  . FRACTURE SURGERY         Home Medications    Prior to Admission medications   Medication Sig Start Date End Date Taking? Authorizing Provider  doxycycline (VIBRAMYCIN) 100 MG capsule Take 1 capsule (100 mg total) by mouth 2 (two) times daily. 07/24/16   Renne CriglerGeiple, Joshua, PA-C  traMADol (ULTRAM) 50 MG tablet Take 1 tablet (50 mg total) by mouth every 6 (six) hours as needed. Patient not taking: Reported on 07/24/2016 05/11/15   Earley FavorSchulz, Gail, NP    Family History No family history on file.  Social History Social History   Tobacco Use    . Smoking status: Current Every Day Smoker    Packs/day: 2.00    Types: Cigarettes  . Smokeless tobacco: Never Used  Substance Use Topics  . Alcohol use: No  . Drug use: Not on file     Allergies   Patient has no known allergies.   Review of Systems Review of Systems  Constitutional: Negative for fever.  Cardiovascular: Negative for chest pain.  Gastrointestinal: Negative for bowel incontinence.  Genitourinary: Negative for bladder incontinence and dysuria.  Musculoskeletal: Positive for back pain.  Neurological: Negative for tingling, weakness, numbness and paresthesias.  All other systems reviewed and are negative.    Physical Exam Updated Vital Signs BP (!) 151/97 (BP Location: Right Arm)   Pulse 98   Temp (!) 97.4 F (36.3 C) (Oral)   Resp 17   SpO2 100%   Physical Exam  Constitutional: He is oriented to person, place, and time. He appears well-developed and well-nourished. No distress.  HENT:  Head: Normocephalic and atraumatic.  Mouth/Throat: Oropharynx is clear and moist.  Eyes: Conjunctivae and EOM are normal. Pupils are equal, round, and reactive to light.  Neck: Normal range of motion. Neck supple.  Cardiovascular: Normal rate, regular rhythm and intact distal pulses.  No murmur heard. Pulmonary/Chest: Effort normal and breath sounds normal. No respiratory distress. He has no wheezes. He has no rales.  Abdominal: Soft. He exhibits no distension. There is no tenderness. There is no rebound  and no guarding.  Musculoskeletal: Normal range of motion. He exhibits no edema.       Thoracic back: He exhibits tenderness and bony tenderness. He exhibits normal range of motion and normal pulse.       Back:  Neurological: He is alert and oriented to person, place, and time.  5/5 strength in the bilateral upper and lower ext.  Gait wnl.  Skin: Skin is warm and dry. No rash noted. No erythema.  Psychiatric: He has a normal mood and affect. His behavior is normal.   Nursing note and vitals reviewed.    ED Treatments / Results  Labs (all labs ordered are listed, but only abnormal results are displayed) Labs Reviewed - No data to display  EKG  EKG Interpretation None       Radiology Dg Thoracic Spine W/swimmers  Result Date: 12/21/2017 CLINICAL DATA:  Back pain EXAM: THORACIC SPINE - 3 VIEWS COMPARISON:  None. FINDINGS: Spurring in the lower thoracic spine. Normal alignment. No fracture. Visualized lungs clear. Heart is normal size. No effusions. IMPRESSION: No acute bony abnormality. Electronically Signed   By: Charlett Nose M.D.   On: 12/21/2017 11:32    Procedures Procedures (including critical care time)  Medications Ordered in ED Medications  ketorolac (TORADOL) 30 MG/ML injection 30 mg (not administered)  cyclobenzaprine (FLEXERIL) tablet 10 mg (not administered)     Initial Impression / Assessment and Plan / ED Course  I have reviewed the triage vital signs and the nursing notes.  Pertinent labs & imaging results that were available during my care of the patient were reviewed by me and considered in my medical decision making (see chart for details).     Pt with gradual onset of back pain suggestive of muscular vs DDD.  No neurovascular compromise and no incontinence.  Pt has no infectious sx, hx of CA  or other red flags concerning for pathologic back pain. Pt does not use IVD. Pt is able to ambulate but is painful.  Normal strength and sensation on exam.  Denies trauma but does lift heavy objects routinely. Will give pt pain control and get thoracic imaging. to return for developement of above sx.  12:10 PM Imaging wnl.   Final Clinical Impressions(s) / ED Diagnoses   Final diagnoses:  Back pain    ED Discharge Orders        Ordered    cyclobenzaprine (FLEXERIL) 10 MG tablet  2 times daily PRN     12/21/17 1208       Gwyneth Sprout, MD 12/21/17 1211

## 2017-12-21 NOTE — Discharge Instructions (Signed)
Try lidocaine patches to help with pain and hot showers.  Limit lifting.  Only Ibuprofen 600-800mg  every 6 hours

## 2017-12-21 NOTE — ED Notes (Signed)
Bed: WA31 Expected date:  Expected time:  Means of arrival:  Comments: 

## 2017-12-21 NOTE — ED Notes (Signed)
Patient transported to X-ray 

## 2017-12-21 NOTE — ED Triage Notes (Signed)
Patient c/o mid back pain for several days that is constant. Denies any falls or injuries.

## 2017-12-21 NOTE — ED Notes (Signed)
ED Provider at bedside. PLUNKETT AT BEDSIDE

## 2019-04-02 ENCOUNTER — Emergency Department (HOSPITAL_COMMUNITY)
Admission: EM | Admit: 2019-04-02 | Discharge: 2019-04-02 | Disposition: A | Discharge status: Home / Self Care | Payer: Medicaid Other | Attending: Emergency Medicine | Admitting: Emergency Medicine

## 2019-04-02 ENCOUNTER — Other Ambulatory Visit: Payer: Self-pay

## 2019-04-02 ENCOUNTER — Encounter (HOSPITAL_COMMUNITY): Payer: Self-pay

## 2019-04-02 DIAGNOSIS — X58XXXA Exposure to other specified factors, initial encounter: Secondary | ICD-10-CM | POA: Diagnosis not present

## 2019-04-02 DIAGNOSIS — Y9389 Activity, other specified: Secondary | ICD-10-CM | POA: Diagnosis not present

## 2019-04-02 DIAGNOSIS — Y929 Unspecified place or not applicable: Secondary | ICD-10-CM | POA: Insufficient documentation

## 2019-04-02 DIAGNOSIS — Y999 Unspecified external cause status: Secondary | ICD-10-CM | POA: Insufficient documentation

## 2019-04-02 DIAGNOSIS — S61317A Laceration without foreign body of left little finger with damage to nail, initial encounter: Secondary | ICD-10-CM | POA: Diagnosis not present

## 2019-04-02 DIAGNOSIS — S6992XA Unspecified injury of left wrist, hand and finger(s), initial encounter: Secondary | ICD-10-CM | POA: Diagnosis present

## 2019-04-02 DIAGNOSIS — F17210 Nicotine dependence, cigarettes, uncomplicated: Secondary | ICD-10-CM | POA: Diagnosis not present

## 2019-04-02 DIAGNOSIS — Z79899 Other long term (current) drug therapy: Secondary | ICD-10-CM | POA: Diagnosis not present

## 2019-04-02 HISTORY — DX: Other specified health status: Z78.9

## 2019-04-02 HISTORY — DX: Schizophrenia, unspecified: F20.9

## 2019-04-02 MED ORDER — LIDOCAINE HCL 2 % IJ SOLN
20.0000 mL | Freq: Once | INTRAMUSCULAR | Status: AC
  Administered 2019-04-02: 400 mg
  Filled 2019-04-02: qty 20

## 2019-04-02 NOTE — ED Provider Notes (Addendum)
[name] EMERGENCY DEPARTMENT Provider Note   CSN: [id number] Arrival date & time: 04/02/19  1348    History   Chief Complaint Chief Complaint  Patient presents with  . Finger Injury    HPI Dale Riley is a 38 y.o. male.     HPI   38 year old male presents today with a laceration to his left finger.  Patient notes he was working on a car when he caused a laceration to the ulnar aspect of the pinky.  He denies any loss of sensation strength or range of motion of the finger.  He notes his last tetanus shot was in 2017.  Past Medical History:  Diagnosis Date  . Medical history non-contributory   . Schizophrenic disorder (HCC)     There are no active problems to display for this patient.   Past Surgical History:  Procedure Laterality Date  . FRACTURE SURGERY          Home Medications    Prior to Admission medications   Medication Sig Start Date End Date Taking? Authorizing Provider  cyclobenzaprine (FLEXERIL) 10 MG tablet Take 1 tablet (10 mg total) by mouth 2 (two) times daily as needed for muscle spasms. 12/21/17   Gwyneth Sprout, MD  doxycycline (VIBRAMYCIN) 100 MG capsule Take 1 capsule (100 mg total) by mouth 2 (two) times daily. 07/24/16   Renne Crigler, PA-C  traMADol (ULTRAM) 50 MG tablet Take 1 tablet (50 mg total) by mouth every 6 (six) hours as needed. Patient not taking: Reported on 07/24/2016 05/11/15   Earley Favor, NP    Family History History reviewed. No pertinent family history.  Social History Social History   Tobacco Use  . Smoking status: Current Every Day Smoker    Packs/day: 2.00    Types: Cigarettes  . Smokeless tobacco: Never Used  Substance Use Topics  . Alcohol use: No  . Drug use: Never     Allergies   Patient has no known allergies.   Review of Systems Review of Systems  All other systems reviewed and are negative.    Physical Exam Updated Vital Signs BP (!) 126/96 (BP Location: Right Arm)    Pulse 78   Temp 98.6 F (37 C) (Oral)   Resp 18   Ht 5\' 10"  (1.778 m)   Wt 69.9 kg   SpO2 100%   BMI 22.10 kg/m   Physical Exam Vitals signs and nursing note reviewed.  Constitutional:      Appearance: He is well-developed.  HENT:     Head: Normocephalic and atraumatic.  Eyes:     General: No scleral icterus.       Right eye: No discharge.        Left eye: No discharge.     Conjunctiva/sclera: Conjunctivae normal.     Pupils: Pupils are equal, round, and reactive to light.  Neck:     Musculoskeletal: Normal range of motion.     Vascular: No JVD.     Trachea: No tracheal deviation.  Pulmonary:     Effort: Pulmonary effort is normal.     Breath sounds: No stridor.  Musculoskeletal:     Comments: 2.5 cm laceration noted to the left ulnar pinky, no deep space involvement, full active range of motion and's resistance and sensation  Neurological:     Mental Status: He is alert and oriented to person, place, and time.     Coordination: Coordination normal.  Psychiatric:  Behavior: Behavior normal.        Thought Content: Thought content normal.        Judgment: Judgment normal.      ED Treatments / Results  Labs (all labs ordered are listed, but only abnormal results are displayed) Labs Reviewed - No data to display  EKG None  Radiology No results found.  Procedures .Marland Kitchen.Laceration Repair Date/Time: 04/02/2019 3:05 PM Performed by: Eyvonne MechanicHedges, Akim Watkinson, PA-C Authorized by: Eyvonne MechanicHedges, Jamieson Lisa, PA-C   Consent:    Consent obtained:  Verbal   Consent given by:  Patient   Risks discussed:  Infection, need for additional repair, poor cosmetic result and pain   Alternatives discussed:  No treatment and delayed treatment Anesthesia (see MAR for exact dosages):    Anesthesia method:  Local infiltration   Local anesthetic:  Lidocaine 2% w/o epi Laceration details:    Location: Left pinky.   Length (cm):  2.5 Repair type:    Repair type:  Simple Pre-procedure  details:    Preparation:  Patient was prepped and draped in usual sterile fashion Exploration:    Hemostasis achieved with:  Direct pressure   Wound exploration: wound explored through full range of motion and entire depth of wound probed and visualized     Wound extent: no fascia violation noted, no foreign bodies/material noted, no muscle damage noted, no nerve damage noted, no tendon damage noted, no underlying fracture noted and no vascular damage noted     Contaminated: yes   Treatment:    Area cleansed with:  Betadine   Amount of cleaning:  Extensive   Irrigation solution:  Tap water   Irrigation method:  Tap   Visualized foreign bodies/material removed: no   Skin repair:    Repair method:  Sutures   Suture size:  4-0   Suture material:  Fast-absorbing gut   Suture technique:  Simple interrupted   Number of sutures:  7 Post-procedure details:    Dressing:  Non-adherent dressing   Patient tolerance of procedure:  Tolerated well, no immediate complications   (including critical care time)  Medications Ordered in ED Medications  lidocaine (XYLOCAINE) 2 % (with pres) injection 400 mg (has no administration in time range)     Initial Impression / Assessment and Plan / ED Course  I have reviewed the triage vital signs and the nursing notes.  Pertinent labs & imaging results that were available during my care of the patient were reviewed by me and considered in my medical decision making (see chart for details).        Labs:   Imaging:  Consults:  Therapeutics:  Discharge Meds:   Assessment/Plan: 38 year old male presents today with laceration.  No signs of deep space involvement.  Discharged with symptomatic care and strict return precautions.     Final Clinical Impressions(s) / ED Diagnoses   Final diagnoses:  Laceration of left little finger without foreign body with damage to nail, initial encounter    ED Discharge Orders    None       Rosalio LoudHedges,  Tadarrius Burch, PA-C 04/02/19 1507    Khianna Blazina, Tinnie GensJeffrey, PA-C 04/02/19 1509    Lockie Molauratolo, Adam, DO 04/02/19 1547

## 2019-04-02 NOTE — ED Notes (Signed)
During pts period of not speaking or responding to staff, phone rang and pt was handed phone. Pt answered phone and began to speak to person on other end. Pt GCS 15, A&Ox4, NARD.

## 2019-04-02 NOTE — ED Notes (Signed)
Pt arrives back from CT and transporter asked this RN to step in room. Pt laying on stretcher w/ eyes open, respirations are even and unlabored and pt in no acute distress. Attempted to speak to pt, pt would not respond. Attempted sternal rub w/ no change in status. Attempted visual threat w/ no response. Pt is maintaining own airway, satting 97%, NARD/NAD. PA Hedges made aware of pt status, to bedside to assess. No new orders at this time.

## 2019-04-02 NOTE — Discharge Instructions (Addendum)
Please read attached information. If you experience any new or worsening signs or symptoms please return to the emergency room for evaluation. Please follow-up with your primary care provider or specialist as discussed.  °

## 2019-04-02 NOTE — ED Triage Notes (Signed)
Pt arrives POV for eval of L pinkie finger lac sustained while changing brakes in his car. Reports lac is to the bone, hemostatic on arrival w/ dsg in place. States last tetanus 2017.

## 2021-04-19 ENCOUNTER — Encounter (HOSPITAL_COMMUNITY): Payer: Self-pay | Admitting: Emergency Medicine

## 2021-04-19 ENCOUNTER — Other Ambulatory Visit: Payer: Self-pay

## 2021-04-19 ENCOUNTER — Emergency Department (HOSPITAL_COMMUNITY)
Admission: EM | Admit: 2021-04-19 | Discharge: 2021-04-19 | Disposition: A | Discharge status: Home / Self Care | Payer: Medicaid Other | Attending: Emergency Medicine | Admitting: Emergency Medicine

## 2021-04-19 ENCOUNTER — Emergency Department (HOSPITAL_COMMUNITY): Payer: Medicaid Other

## 2021-04-19 DIAGNOSIS — S61210A Laceration without foreign body of right index finger without damage to nail, initial encounter: Secondary | ICD-10-CM | POA: Insufficient documentation

## 2021-04-19 DIAGNOSIS — W293XXA Contact with powered garden and outdoor hand tools and machinery, initial encounter: Secondary | ICD-10-CM | POA: Insufficient documentation

## 2021-04-19 DIAGNOSIS — F17210 Nicotine dependence, cigarettes, uncomplicated: Secondary | ICD-10-CM | POA: Diagnosis not present

## 2021-04-19 DIAGNOSIS — S6991XA Unspecified injury of right wrist, hand and finger(s), initial encounter: Secondary | ICD-10-CM | POA: Diagnosis present

## 2021-04-19 MED ORDER — TRAMADOL HCL 50 MG PO TABS: 50.0000 mg | ORAL_TABLET | Freq: Four times a day (QID) | ORAL | 0 refills | Status: AC | PRN

## 2021-04-19 MED ORDER — SULFAMETHOXAZOLE-TRIMETHOPRIM 800-160 MG PO TABS: 1.0000 | ORAL_TABLET | Freq: Two times a day (BID) | ORAL | 0 refills | Status: AC

## 2021-04-19 MED ORDER — LIDOCAINE HCL 2 % IJ SOLN
10.0000 mL | Freq: Once | INTRAMUSCULAR | Status: DC
  Filled 2021-04-19: qty 20

## 2021-04-19 MED ORDER — SULFAMETHOXAZOLE-TRIMETHOPRIM 800-160 MG PO TABS
1.0000 | ORAL_TABLET | Freq: Once | ORAL | Status: AC
  Administered 2021-04-19: 1 via ORAL
  Filled 2021-04-19: qty 1

## 2021-04-19 MED ORDER — TRAMADOL HCL 50 MG PO TABS
50.0000 mg | ORAL_TABLET | Freq: Once | ORAL | Status: AC
  Administered 2021-04-19: 50 mg via ORAL
  Filled 2021-04-19: qty 1

## 2021-04-19 NOTE — ED Triage Notes (Signed)
Pt presents with lac to right index finger from a saw while cleaning up storm debris today. Bleeding controlled at this time. Tetanus within last 3 years.

## 2021-04-19 NOTE — ED Notes (Signed)
Patient verbalizes understanding of discharge instructions. Prescriptions and follow-up care reviewed. Opportunity for questioning and answers were provided. Armband removed by staff, pt discharged from ED ambulatory.  

## 2021-04-19 NOTE — Discharge Instructions (Signed)
Take Tylenol or Motrin for pain.  Take tramadol for severe pain  Given that the wound appears unclean, I have ordered Bactrim twice daily for 5 days to prevent infection.  Suture removal in a week  Return to ER if you have fever and purulent drainage and severe pain in fingers turning blue

## 2021-04-19 NOTE — ED Provider Notes (Signed)
[name] EMERGENCY DEPARTMENT Provider Note   CSN: [id number] Arrival date & time: 04/19/21  1635     History Chief Complaint  Patient presents with  . Extremity Laceration    Dale Riley is a 40 y.o. male history of schizophrenia here presenting with right index finger laceration.  Patient states that he was using a chainsaw and accidentally cut the right index finger.  He was noted to have a superficial laceration of the index finger.  He states that he is able to move the finger.  He states that he has no numbness of the finger.  His tetanus is up-to-date.  He states that he has multiple lacerations previously that require sutures  The history is provided by the patient.       Past Medical History:  Diagnosis Date  . Medical history non-contributory   . Schizophrenic disorder (HCC)     There are no problems to display for this patient.   Past Surgical History:  Procedure Laterality Date  . FRACTURE SURGERY         No family history on file.  Social History   Tobacco Use  . Smoking status: Current Every Day Smoker    Packs/day: 2.00    Types: Cigarettes  . Smokeless tobacco: Never Used  Vaping Use  . Vaping Use: Never used  Substance Use Topics  . Alcohol use: No  . Drug use: Never    Home Medications Prior to Admission medications   Medication Sig Start Date End Date Taking? Authorizing Provider  cyclobenzaprine (FLEXERIL) 10 MG tablet Take 1 tablet (10 mg total) by mouth 2 (two) times daily as needed for muscle spasms. 12/21/17   Gwyneth Sprout, MD  doxycycline (VIBRAMYCIN) 100 MG capsule Take 1 capsule (100 mg total) by mouth 2 (two) times daily. 07/24/16   Renne Crigler, PA-C  traMADol (ULTRAM) 50 MG tablet Take 1 tablet (50 mg total) by mouth every 6 (six) hours as needed. Patient not taking: Reported on 07/24/2016 05/11/15   Earley Favor, NP    Allergies    Patient has no known allergies.  Review of Systems   Review of  Systems  Skin: Positive for wound.  All other systems reviewed and are negative.   Physical Exam Updated Vital Signs BP 124/83 (BP Location: Left Arm)   Pulse 88   Temp 98.4 F (36.9 C) (Oral)   Resp 20   SpO2 98%   Physical Exam Vitals and nursing note reviewed.  HENT:     Head: Normocephalic.     Nose: Nose normal.     Mouth/Throat:     Mouth: Mucous membranes are moist.  Eyes:     Pupils: Pupils are equal, round, and reactive to light.  Cardiovascular:     Rate and Rhythm: Normal rate.     Pulses: Normal pulses.  Pulmonary:     Effort: Pulmonary effort is normal.  Musculoskeletal:     Cervical back: Normal range of motion.     Comments: 4 cm laceration of the right index finger.  It does go across the right PIP joint able to flex and extend the finger.  Normal capillary refill.  Skin:    General: Skin is warm.     Capillary Refill: Capillary refill takes less than 2 seconds.  Neurological:     General: No focal deficit present.     Mental Status: He is alert and oriented to person, place, and time.  Psychiatric:  Mood and Affect: Mood normal.        Behavior: Behavior normal.     ED Results / Procedures / Treatments   Labs (all labs ordered are listed, but only abnormal results are displayed) Labs Reviewed - No data to display  EKG None  Radiology DG Hand Complete Right  Result Date: 04/19/2021 CLINICAL DATA:  Laceration to the second digit from working with a saw, Initial encounter EXAM: RIGHT HAND - COMPLETE 3+ VIEW COMPARISON:  None. FINDINGS: Soft tissue defect is noted along the proximal aspect of the second digit consistent with the given clinical history. No underlying bony abnormality is seen. No foreign body is noted. IMPRESSION: Soft tissue injury without acute bony abnormality. Electronically Signed   By: Alcide Clever M.D.   On: 04/19/2021 17:33    Procedures Procedures   LACERATION REPAIR Performed by: Richardean Canal Authorized by: Richardean Canal Consent: Verbal consent obtained. Risks and benefits: risks, benefits and alternatives were discussed Consent given by: patient Patient identity confirmed: provided demographic data Prepped and Draped in normal sterile fashion Wound explored  Laceration Location: R index finger  Laceration Length:4 cm  No Foreign Bodies seen or palpated  Anesthesia: local infiltration  Local anesthetic: lidocaine 2 % no epinephrine  Anesthetic total: 8 ml  Irrigation method: syringe Amount of cleaning: standard  Skin closure: 6-0 ethilon  Number of sutures: 7  Technique: simple interrupted   Patient tolerance: Patient tolerated the procedure well with no immediate complications.   Medications Ordered in ED Medications  lidocaine (XYLOCAINE) 2 % (with pres) injection 200 mg (has no administration in time range)    ED Course  I have reviewed the triage vital signs and the nursing notes.  Pertinent labs & imaging results that were available during my care of the patient were reviewed by me and considered in my medical decision making (see chart for details).    MDM Rules/Calculators/A&P                         Dale Riley is a 40 y.o. male here presenting with right index finger laceration.  Patient is able to range the finger.  X-ray showed no fracture.  Wound is extensively irrigated.  I was able to put 7 stitches.  I told him that he needs suture removal in 7 days.  His tetanus is up-to-date.  Told him that given it is a dirty wound will put him on antibiotics prophylactically    Final Clinical Impression(s) / ED Diagnoses Final diagnoses:  None    Rx / DC Orders ED Discharge Orders    None       Charlynne Pander, MD 04/19/21 1941

## 2021-04-19 NOTE — ED Provider Notes (Signed)
Emergency Medicine Provider Triage Evaluation Note  Giovanie Lefebre , a 40 y.o. male  was evaluated in triage.  Pt complains of right index laceration. Tetanus last 2 yr  Review of Systems  Positive: Finger laceration Negative: Tingling numbness  Physical Exam  BP 124/83 (BP Location: Left Arm)   Pulse 88   Temp 98.4 F (36.9 C) (Oral)   Resp 20   SpO2 98%  Gen:   Awake, no distress  Resp:  Normal effort  MSK:   Dorsal laceration over right index finger. Full ROM  Medical Decision Making  Medically screening exam initiated at 4:50 PM.  Appropriate orders placed.  Undray Allman was informed that the remainder of the evaluation will be completed by another provider, this initial triage assessment does not replace that evaluation, and the importance of remaining in the ED until their evaluation is complete.    Liberty Handy, PA-C 04/19/21 1651    Virgina Norfolk, DO 04/20/21 [id number]

## 2022-06-09 IMAGING — DX DG HAND COMPLETE 3+V*R*
3 series · 3 of 3 positions shown · non-contrast
Comparison: None.

CLINICAL DATA: Laceration to the second digit from working with a
saw, Initial encounter

EXAM:
RIGHT HAND - COMPLETE 3+ VIEW

[x hand pa right]
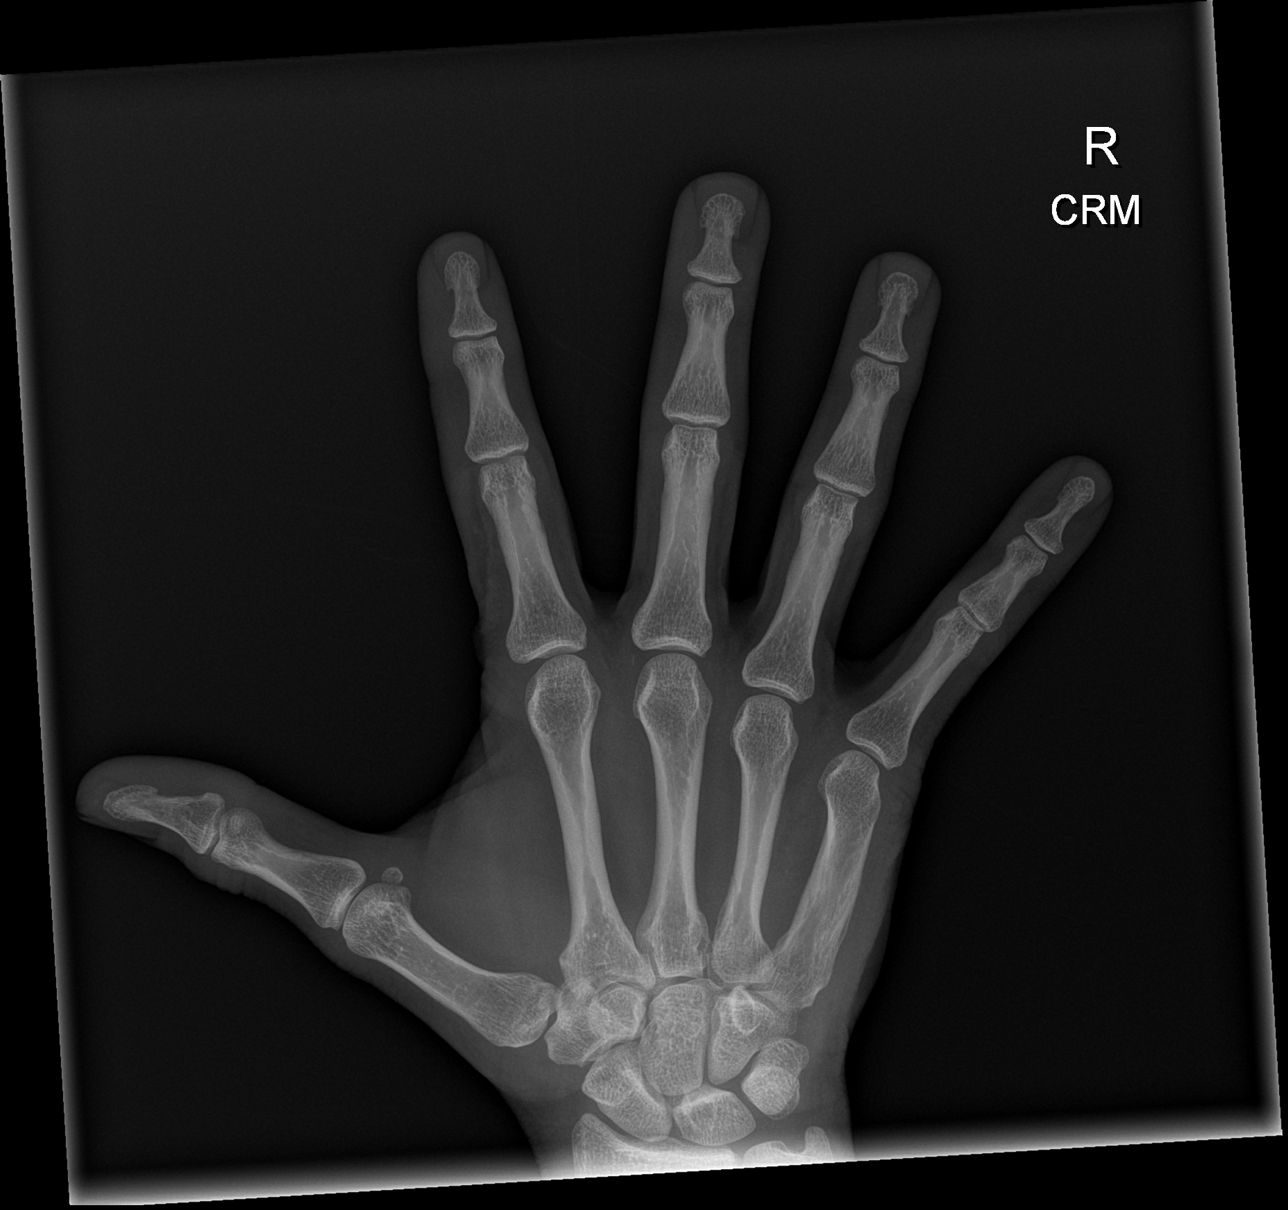

[x hand obl right]
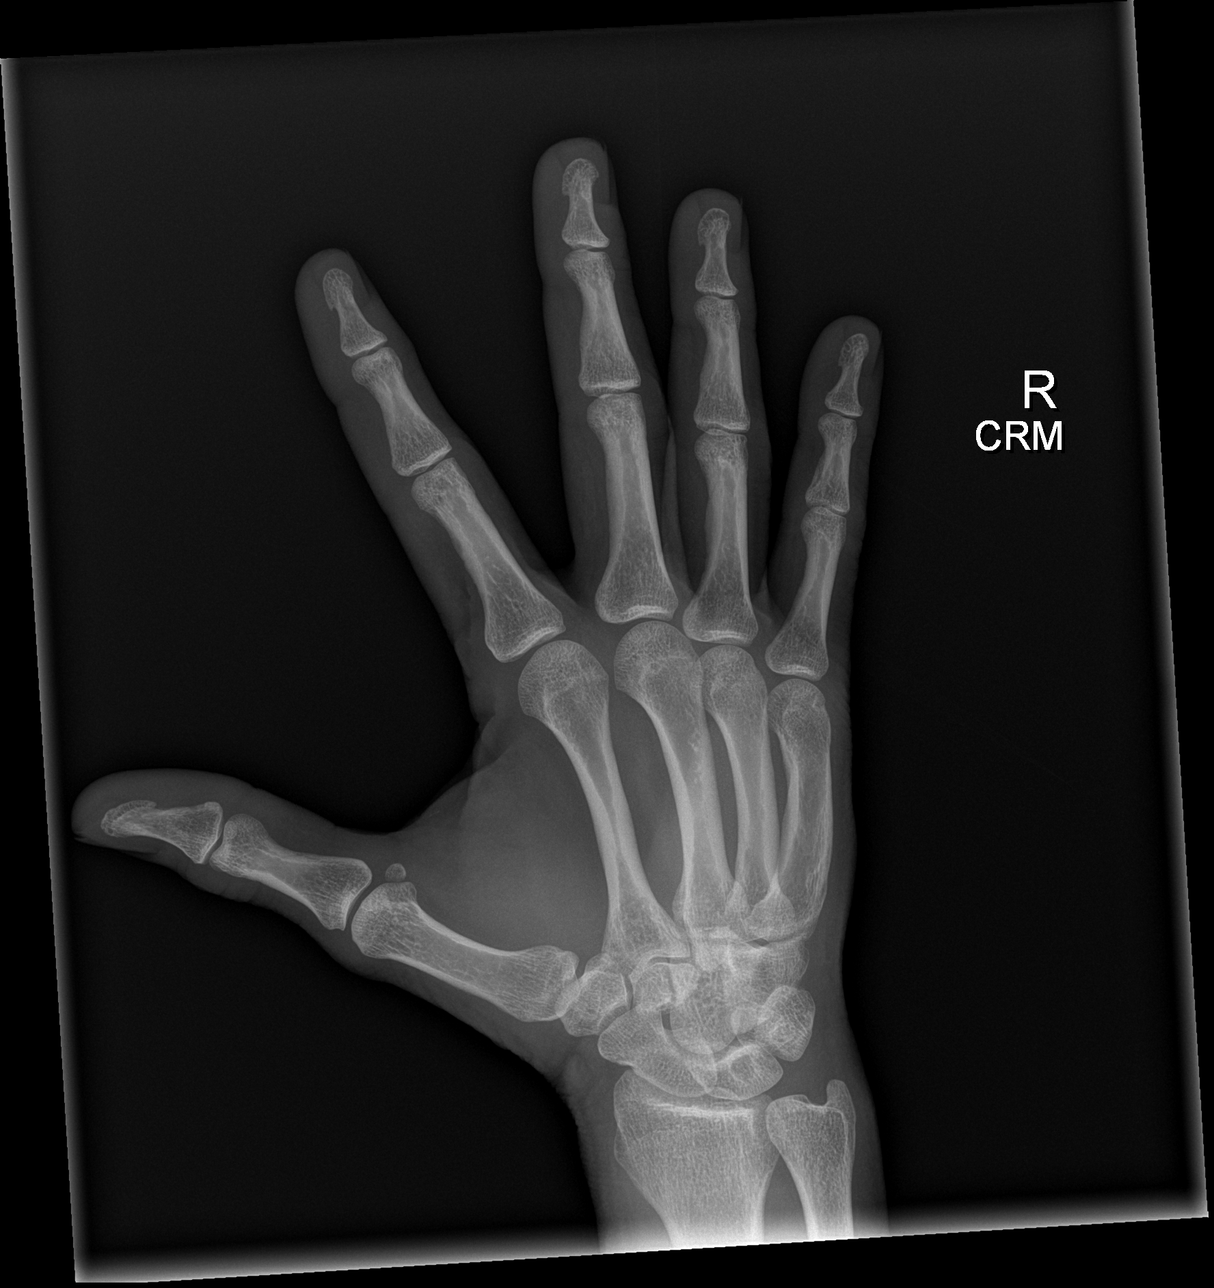

[x hand lat right]
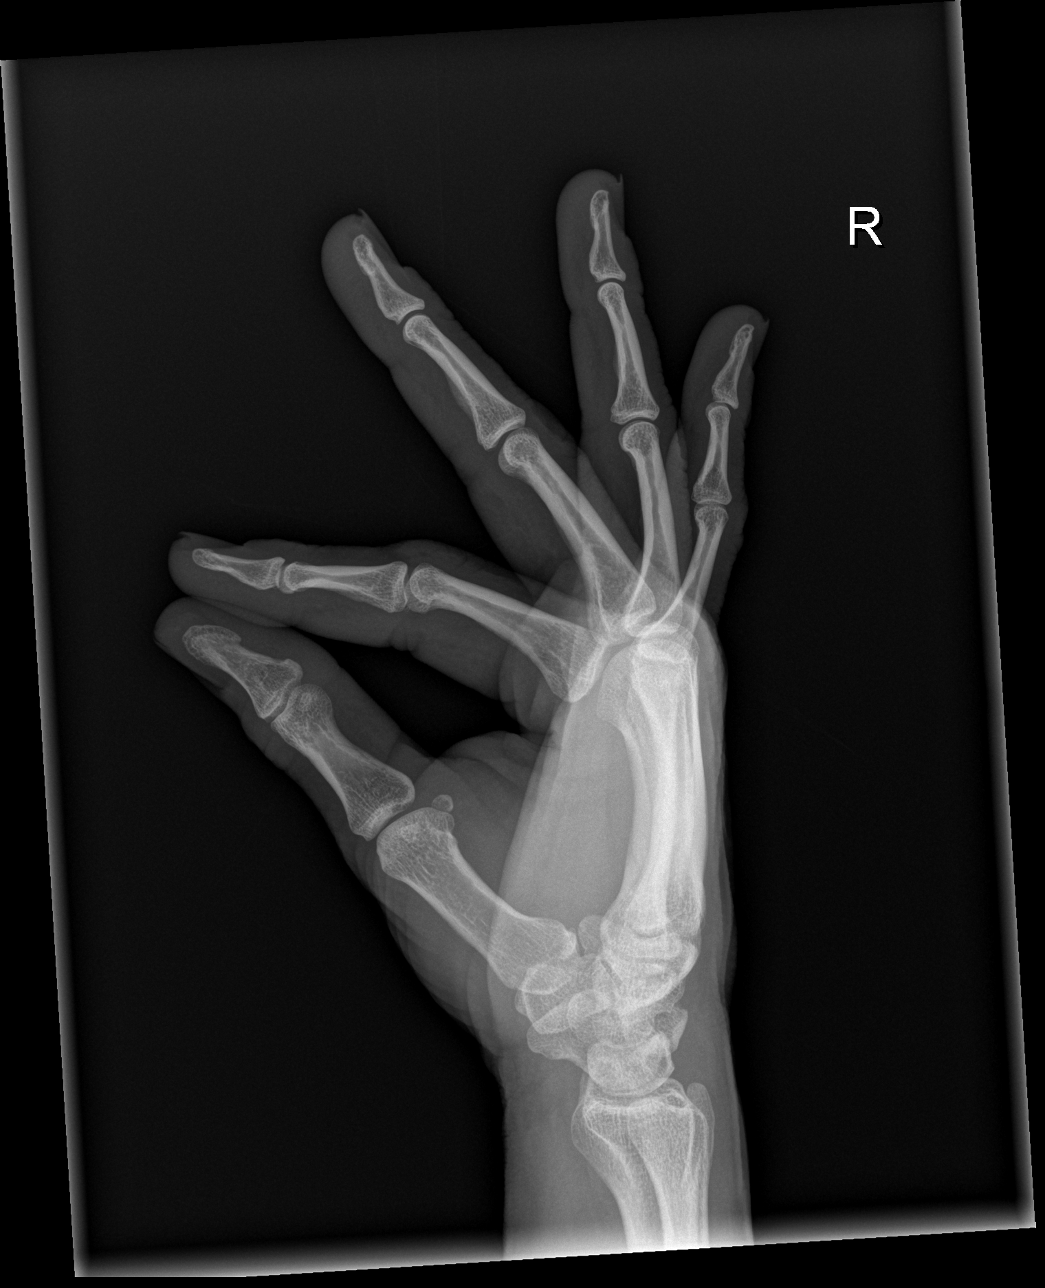

[3 of 3 positions shown; findings below may reference images not displayed]

FINDINGS: Soft tissue defect is noted along the proximal aspect of the second
digit consistent with the given clinical history. No underlying bony
abnormality is seen. No foreign body is noted.
IMPRESSION: Soft tissue injury without acute bony abnormality.
